# Patient Record
Sex: Male | Born: 1950 | Marital: Married | Smoking: Never smoker
Health system: Southern US, Community
[De-identification: ages and names within clinical notes are randomized; demographics above are authoritative.]

---

## 2010-12-17 ENCOUNTER — Inpatient Hospital Stay (INDEPENDENT_AMBULATORY_CARE_PROVIDER_SITE_OTHER)
Admission: RE | Admit: 2010-12-17 | Discharge: 2010-12-17 | Disposition: A | Discharge status: Home / Self Care | Payer: BC Managed Care – PPO | Source: Ambulatory Visit | Attending: Family Medicine | Admitting: Family Medicine

## 2010-12-17 DIAGNOSIS — B029 Zoster without complications: Secondary | ICD-10-CM

## 2011-10-28 ENCOUNTER — Encounter: Payer: Self-pay | Admitting: Internal Medicine

## 2011-10-28 ENCOUNTER — Ambulatory Visit (INDEPENDENT_AMBULATORY_CARE_PROVIDER_SITE_OTHER): Payer: BC Managed Care – PPO | Admitting: Internal Medicine

## 2011-10-28 DIAGNOSIS — Z23 Encounter for immunization: Secondary | ICD-10-CM

## 2011-10-28 DIAGNOSIS — Z0000 Encounter for general adult medical examination without abnormal findings: Secondary | ICD-10-CM | POA: Insufficient documentation

## 2011-10-28 NOTE — Progress Notes (Signed)
  Subjective:    Patient ID: Albert Fields, male    DOB: 1950-12-04, 61 y.o.   MRN: [id number]  HPI New patient, here for a CPX   Past medical history Shingles ~ 2012  Past surgical history Cataract surgery L 10-2011  Social history Married, 2 children Tobacco-- never ETOH-- socially Semi retired, Youth worker  Diet-- regular to healthy  Exercise-- active, some golf  Family history Diabetes-- no CAD-- heart dz uncles? Stroke-- no Colon cancer-- no Prostate cancer-- no   Review of Systems No chest pain or shortness of breath No nausea, vomiting, diarrhea. No blood in the stools Denies any hematuria or difficulty urinating. No anxiety or depression.     Objective:   Physical Exam  General -- alert, well-developed, and well-nourished.   Neck --no thyromegaly , normal carotid pulse Lungs -- normal respiratory effort, no intercostal retractions, no accessory muscle use, and normal breath sounds.   Heart-- normal rate, regular rhythm, no murmur, and no gallop.   Abdomen--soft, non-tender, no distention, no masses, no HSM, no guarding, and no rigidity. No bruit   Extremities-- no pretibial edema bilaterally Rectal-- No external abnormalities noted. Normal sphincter tone. No rectal masses or tenderness. Brown stool, Hemoccult negative Prostate:  Prostate gland firm and smooth, no enlargement, nodularity, tenderness, mass, asymmetry or induration. Neurologic-- alert & oriented X3 and strength normal in all extremities. Psych-- Cognition and judgment appear intact. Alert and cooperative with normal attention span and concentration.  not anxious appearing and not depressed appearing.      Assessment & Plan:

## 2011-10-28 NOTE — Assessment & Plan Note (Addendum)
Last TD? Provided a Tdap today Never had a cscope, recommend GI referral, will think about it, took w/ him an iFOB Will discuss zostavax next year Labs EKG, RBBB, no previous EKG. Patient asymptomatic. Will consider this EKG his baseline. Diet-exercise discussed  He has a number of skin lesions, recommend to see dermatology.

## 2011-10-28 NOTE — Patient Instructions (Signed)
Please come back fasting: FLP, CMP, CBC, TSH, PSA--- dx V70

## 2011-10-29 ENCOUNTER — Encounter: Payer: Self-pay | Admitting: Internal Medicine

## 2011-11-03 ENCOUNTER — Other Ambulatory Visit (INDEPENDENT_AMBULATORY_CARE_PROVIDER_SITE_OTHER): Payer: BC Managed Care – PPO

## 2011-11-03 DIAGNOSIS — Z0000 Encounter for general adult medical examination without abnormal findings: Secondary | ICD-10-CM

## 2011-11-03 LAB — CBC WITH DIFFERENTIAL/PLATELET
Basophils Relative: 0.5 % (ref 0.0–3.0)
Eosinophils Relative: 2.8 % (ref 0.0–5.0)
HCT: 42.2 % (ref 39.0–52.0)
Hemoglobin: 13.8 g/dL (ref 13.0–17.0)
Lymphs Abs: 2.1 10*3/uL (ref 0.7–4.0)
Monocytes Relative: 6.7 % (ref 3.0–12.0)
Neutro Abs: 4.4 10*3/uL (ref 1.4–7.7)
RBC: 4.39 Mil/uL (ref 4.22–5.81)
WBC: 7.2 10*3/uL (ref 4.5–10.5)

## 2011-11-03 LAB — LIPID PANEL
HDL: 63.4 mg/dL (ref >39.00)
Total CHOL/HDL Ratio: 3
Triglycerides: 52 mg/dL (ref 0.0–149.0)

## 2011-11-03 LAB — COMPREHENSIVE METABOLIC PANEL
ALT: 13 U/L (ref 0–53)
BUN: 15 mg/dL (ref 6–23)
CO2: 29 mEq/L (ref 19–32)
Creatinine, Ser: 1.1 mg/dL (ref 0.4–1.5)
GFR: 74.44 mL/min (ref >60.00)
Total Bilirubin: 0.6 mg/dL (ref 0.3–1.2)

## 2011-11-03 NOTE — Progress Notes (Signed)
Labs only

## 2011-11-10 ENCOUNTER — Other Ambulatory Visit (INDEPENDENT_AMBULATORY_CARE_PROVIDER_SITE_OTHER): Payer: BC Managed Care – PPO

## 2011-11-10 ENCOUNTER — Encounter: Payer: Self-pay | Admitting: *Deleted

## 2011-11-10 DIAGNOSIS — E039 Hypothyroidism, unspecified: Secondary | ICD-10-CM

## 2011-11-10 LAB — T3, FREE: T3, Free: 2.9 pg/mL (ref 2.3–4.2)

## 2011-11-10 NOTE — Progress Notes (Signed)
Labs only

## 2012-06-18 ENCOUNTER — Telehealth: Payer: Self-pay | Admitting: Internal Medicine

## 2012-06-18 DIAGNOSIS — E039 Hypothyroidism, unspecified: Secondary | ICD-10-CM

## 2012-06-18 NOTE — Telephone Encounter (Signed)
Please arrange TSH , free T3 and free T4---- dx hypothyroidism

## 2012-06-20 ENCOUNTER — Encounter: Payer: Self-pay | Admitting: *Deleted

## 2012-06-20 NOTE — Telephone Encounter (Signed)
Letter mailed to pt to notify him he is due to have labs rechecked.  Lab orders entered.

## 2013-04-05 ENCOUNTER — Ambulatory Visit (INDEPENDENT_AMBULATORY_CARE_PROVIDER_SITE_OTHER): Payer: BC Managed Care – PPO | Admitting: *Deleted

## 2013-04-05 DIAGNOSIS — Z23 Encounter for immunization: Secondary | ICD-10-CM

## 2013-04-12 ENCOUNTER — Ambulatory Visit: Payer: BC Managed Care – PPO | Admitting: *Deleted

## 2013-04-12 DIAGNOSIS — Z23 Encounter for immunization: Secondary | ICD-10-CM

## 2014-03-29 ENCOUNTER — Encounter (HOSPITAL_COMMUNITY): Payer: Self-pay | Admitting: Emergency Medicine

## 2014-03-29 ENCOUNTER — Emergency Department (HOSPITAL_COMMUNITY)
Admission: EM | Admit: 2014-03-29 | Discharge: 2014-03-29 | Disposition: A | Discharge status: Home / Self Care | Payer: BC Managed Care – PPO | Attending: Emergency Medicine | Admitting: Emergency Medicine

## 2014-03-29 ENCOUNTER — Emergency Department (HOSPITAL_COMMUNITY): Payer: BC Managed Care – PPO

## 2014-03-29 DIAGNOSIS — S99921A Unspecified injury of right foot, initial encounter: Secondary | ICD-10-CM | POA: Diagnosis present

## 2014-03-29 DIAGNOSIS — Y9289 Other specified places as the place of occurrence of the external cause: Secondary | ICD-10-CM | POA: Insufficient documentation

## 2014-03-29 DIAGNOSIS — Y9389 Activity, other specified: Secondary | ICD-10-CM | POA: Diagnosis not present

## 2014-03-29 DIAGNOSIS — W208XXA Other cause of strike by thrown, projected or falling object, initial encounter: Secondary | ICD-10-CM | POA: Insufficient documentation

## 2014-03-29 DIAGNOSIS — Z23 Encounter for immunization: Secondary | ICD-10-CM | POA: Diagnosis not present

## 2014-03-29 DIAGNOSIS — S70311A Abrasion, right thigh, initial encounter: Secondary | ICD-10-CM | POA: Insufficient documentation

## 2014-03-29 DIAGNOSIS — Z79899 Other long term (current) drug therapy: Secondary | ICD-10-CM | POA: Diagnosis not present

## 2014-03-29 DIAGNOSIS — S92311A Displaced fracture of first metatarsal bone, right foot, initial encounter for closed fracture: Secondary | ICD-10-CM | POA: Diagnosis not present

## 2014-03-29 DIAGNOSIS — S92301A Fracture of unspecified metatarsal bone(s), right foot, initial encounter for closed fracture: Secondary | ICD-10-CM

## 2014-03-29 LAB — BASIC METABOLIC PANEL
ANION GAP: 14 (ref 5–15)
BUN: 16 mg/dL (ref 6–23)
CHLORIDE: 104 meq/L (ref 96–112)
CO2: 22 mEq/L (ref 19–32)
Calcium: 10 mg/dL (ref 8.4–10.5)
Creatinine, Ser: 1.02 mg/dL (ref 0.50–1.35)
GFR calc non Af Amer: 76 mL/min — ABNORMAL LOW (ref >90)
GFR, EST AFRICAN AMERICAN: 88 mL/min — AB (ref >90)
Glucose, Bld: 98 mg/dL (ref 70–99)
POTASSIUM: 3.8 meq/L (ref 3.7–5.3)
Sodium: 140 mEq/L (ref 137–147)

## 2014-03-29 LAB — CBC
HCT: 39.4 % (ref 39.0–52.0)
HEMOGLOBIN: 13.8 g/dL (ref 13.0–17.0)
MCH: 31.7 pg (ref 26.0–34.0)
MCHC: 35 g/dL (ref 30.0–36.0)
MCV: 90.6 fL (ref 78.0–100.0)
Platelets: 231 10*3/uL (ref 150–400)
RBC: 4.35 MIL/uL (ref 4.22–5.81)
RDW: 13.3 % (ref 11.5–15.5)
WBC: 13.4 10*3/uL — AB (ref 4.0–10.5)

## 2014-03-29 MED ORDER — MORPHINE SULFATE 4 MG/ML IJ SOLN
6.0000 mg | Freq: Once | INTRAMUSCULAR | Status: DC
  Filled 2014-03-29: qty 2

## 2014-03-29 MED ORDER — TETANUS-DIPHTH-ACELL PERTUSSIS 5-2.5-18.5 LF-MCG/0.5 IM SUSP
0.5000 mL | Freq: Once | INTRAMUSCULAR | Status: AC
  Administered 2014-03-29: 0.5 mL via INTRAMUSCULAR
  Filled 2014-03-29: qty 0.5

## 2014-03-29 MED ORDER — MORPHINE SULFATE 4 MG/ML IJ SOLN: 6.0000 mg | Freq: Once | INTRAMUSCULAR | Status: DC

## 2014-03-29 MED ORDER — HYDROCODONE-ACETAMINOPHEN 5-325 MG PO TABS: 1.0000 | ORAL_TABLET | ORAL | Status: AC | PRN

## 2014-03-29 NOTE — Discharge Instructions (Signed)
Follow up with orthopaedics. Ice and elevate. Non weight bearing. If you were given medicines take as directed.  If you are on coumadin or contraceptives realize their levels and effectiveness is altered by many different medicines.  If you have any reaction (rash, tongues swelling, other) to the medicines stop taking and see a physician.   Please follow up as directed and return to the ER or see a physician for new or worsening symptoms.  Thank you. Filed Vitals:   03/29/14 1703 03/29/14 1902  BP: 155/59 137/67  Pulse: 79 88  Temp: 98.6 F (37 C) 98.9 F (37.2 C)  TempSrc: Oral Oral  Resp: 18 14  SpO2: 100% 100%    Cast or Splint Care Casts and splints support injured limbs and keep bones from moving while they heal. It is important to care for your cast or splint at home.  HOME CARE INSTRUCTIONS  Keep the cast or splint uncovered during the drying period. It can take 24 to 48 hours to dry if it is made of plaster. A fiberglass cast will dry in less than 1 hour.  Do not rest the cast on anything harder than a pillow for the first 24 hours.  Do not put weight on your injured limb or apply pressure to the cast until your health care provider gives you permission.  Keep the cast or splint dry. Wet casts or splints can lose their shape and may not support the limb as well. A wet cast that has lost its shape can also create harmful pressure on your skin when it dries. Also, wet skin can become infected.  Cover the cast or splint with a plastic bag when bathing or when out in the rain or snow. If the cast is on the trunk of the body, take sponge baths until the cast is removed.  If your cast does become wet, dry it with a towel or a blow dryer on the cool setting only.  Keep your cast or splint clean. Soiled casts may be wiped with a moistened cloth.  Do not place any hard or soft foreign objects under your cast or splint, such as cotton, toilet paper, lotion, or powder.  Do not try  to scratch the skin under the cast with any object. The object could get stuck inside the cast. Also, scratching could lead to an infection. If itching is a problem, use a blow dryer on a cool setting to relieve discomfort.  Do not trim or cut your cast or remove padding from inside of it.  Exercise all joints next to the injury that are not immobilized by the cast or splint. For example, if you have a long leg cast, exercise the hip joint and toes. If you have an arm cast or splint, exercise the shoulder, elbow, thumb, and fingers.  Elevate your injured arm or leg on 1 or 2 pillows for the first 1 to 3 days to decrease swelling and pain.It is best if you can comfortably elevate your cast so it is higher than your heart. SEEK MEDICAL CARE IF:   Your cast or splint cracks.  Your cast or splint is too tight or too loose.  You have unbearable itching inside the cast.  Your cast becomes wet or develops a soft spot or area.  You have a bad smell coming from inside your cast.  You get an object stuck under your cast.  Your skin around the cast becomes red or raw.  You have  new pain or worsening pain after the cast has been applied. SEEK IMMEDIATE MEDICAL CARE IF:   You have fluid leaking through the cast.  You are unable to move your fingers or toes.  You have discolored (blue or white), cool, painful, or very swollen fingers or toes beyond the cast.  You have tingling or numbness around the injured area.  You have severe pain or pressure under the cast.  You have any difficulty with your breathing or have shortness of breath.  You have chest pain. Document Released: 05/22/2000 Document Revised: 03/15/2013 Document Reviewed: 12/01/2012 [facility] Patient Information 2015 [place], [place]. This information is not intended to replace advice given to you by your health care provider. Make sure you discuss any questions you have with your health care provider.

## 2014-03-29 NOTE — ED Provider Notes (Signed)
CSN: [id number]     Arrival date & time 03/29/14  1533 History   First MD Initiated Contact with Patient 03/29/14 1708     Chief Complaint  Patient presents with  . Foot Injury     (Consider location/radiation/quality/duration/timing/severity/associated sxs/prior Treatment) HPI Comments: 63 year old right foot swelling and pain since prior to arrival. Patient had 4 doors fall on his dorsum of the right foot. Patient denies other significant injuries. Patient has mild abrasion above the right knee but no knee pain. Tetanus is not up-to-date. Pain with range of motion.  Patient is a 63 y.o. male presenting with foot injury. The history is provided by the patient.  Foot Injury Associated symptoms: no back pain, no fever and no neck pain     History reviewed. No pertinent past medical history. History reviewed. No pertinent past surgical history. Family History  Problem Relation Age of Onset  . Arthritis Mother   . Arthritis Father    History  Substance Use Topics  . Smoking status: Never Smoker   . Smokeless tobacco: Not on file  . Alcohol Use: Yes    Review of Systems  Constitutional: Negative for fever and chills.  HENT: Negative for congestion.   Eyes: Negative for visual disturbance.  Respiratory: Negative for shortness of breath.   Cardiovascular: Negative for chest pain.  Gastrointestinal: Negative for vomiting and abdominal pain.  Genitourinary: Negative for dysuria and flank pain.  Musculoskeletal: Positive for gait problem and joint swelling. Negative for back pain, neck pain and neck stiffness.  Skin: Negative for rash.  Neurological: Negative for weakness, light-headedness and headaches.      Allergies  Review of patient's allergies indicates no known allergies.  Home Medications   Prior to Admission medications   Medication Sig Start Date End Date Taking? Authorizing Provider  Ascorbic Acid (VITAMIN C) 100 MG tablet Take 100 mg by mouth daily.   Yes  Historical Provider, MD  Multiple Vitamins-Minerals (MULTIVITAMIN & MINERAL PO) Take 1 tablet by mouth daily.   Yes Historical Provider, MD  HYDROcodone-acetaminophen (NORCO) 5-325 MG per tablet Take 1-2 tablets by mouth every 4 (four) hours as needed. 03/29/14   Enid SkeensJoshua M Jani Moronta, MD   BP 137/67  Pulse 88  Temp(Src) 98.9 F (37.2 C) (Oral)  Resp 14  SpO2 100% Physical Exam  Nursing note and vitals reviewed. Constitutional: He is oriented to person, place, and time. He appears well-developed and well-nourished.  HENT:  Head: Normocephalic and atraumatic.  Eyes: Conjunctivae are normal. Right eye exhibits no discharge. Left eye exhibits no discharge.  Neck: Normal range of motion. Neck supple. No tracheal deviation present.  Cardiovascular: Normal rate and regular rhythm.   Pulmonary/Chest: Effort normal and breath sounds normal.  Abdominal: Soft. He exhibits no distension. There is no tenderness. There is no guarding.  Musculoskeletal: He exhibits no edema.  Patient has significant swelling and tenderness to mid and distal dorsum of the right foot, dorsalis pedis and posterior tibial pulses 2+. Significant tenderness to palpation. Patient has decreased range of motion due to pain and swelling. No ankle or knee tenderness or swelling on exam. On the right.  Neurological: He is alert and oriented to person, place, and time.  Skin: Skin is warm.  2 cm superficial abrasion  distal right thigh, no active bleeding  Psychiatric: He has a normal mood and affect.    ED Course  Procedures (including critical care time) Labs Review Labs Reviewed  CBC - Abnormal; Notable for the following:  WBC 13.4 (*)    All other components within normal limits  BASIC METABOLIC PANEL - Abnormal; Notable for the following:    GFR calc non Af Amer 76 (*)    GFR calc Af Amer 88 (*)    All other components within normal limits    Imaging Review No results found.   EKG Interpretation None      MDM    Final diagnoses:  Metatarsal fracture, right, closed, initial encounter  Right foot injury, initial encounter   Patient significant mechanism of injury to his right foot concern for foot fracture/midfoot fracture Lisfranc.  Plan for basic blood work in case patient needs to go to the operating room, x-ray and pain medicines. Tetanus updated.  X-ray right foot reviewed by myself showing multiple metatarsal fractures. Patient's pain improved on recheck. Patient requesting to go home and followup outpatient orthopedics. Patient has crutches, splint order placed.  Results and differential diagnosis were discussed with the patient/parent/guardian. Close follow up outpatient was discussed, comfortable with the plan.   Medications  morphine 4 MG/ML injection 6 mg (0 mg Intravenous Hold 03/29/14 1759)  Tdap (BOOSTRIX) injection 0.5 mL (0.5 mLs Intramuscular Given 03/29/14 1801)    Filed Vitals:   03/29/14 1703 03/29/14 1902  BP: 155/59 137/67  Pulse: 79 88  Temp: 98.6 F (37 C) 98.9 F (37.2 C)  TempSrc: Oral Oral  Resp: 18 14  SpO2: 100% 100%    Final diagnoses:  Metatarsal fracture, right, closed, initial encounter  Right foot injury, initial encounter       Enid SkeensJoshua M Jamaiya Tunnell, MD 03/29/14 1925

## 2014-03-29 NOTE — ED Notes (Signed)
Per patient-dropped four doors on right foot today. Right foot grossly swollen and bruised. Has full sensation but limited movement noted. 1+ pedal pulses and 2+ posterior tibial pulses. No medications taken today. Patient reports he is unable to place weight on foot. No other questions/concerns. MD Zavitz at bedside.

## 2014-03-29 NOTE — ED Notes (Signed)
Ortho tech called 

## 2015-07-01 IMAGING — CR DG FOOT COMPLETE 3+V*R*
3 series · 3 of 3 positions shown · non-contrast
Comparison: None.

CLINICAL DATA: Dropped doors on foot. Foot pain, bruising and
swelling. Initial encounter.

EXAM:
RIGHT FOOT COMPLETE - 3+ VIEW

[x foot ap right]
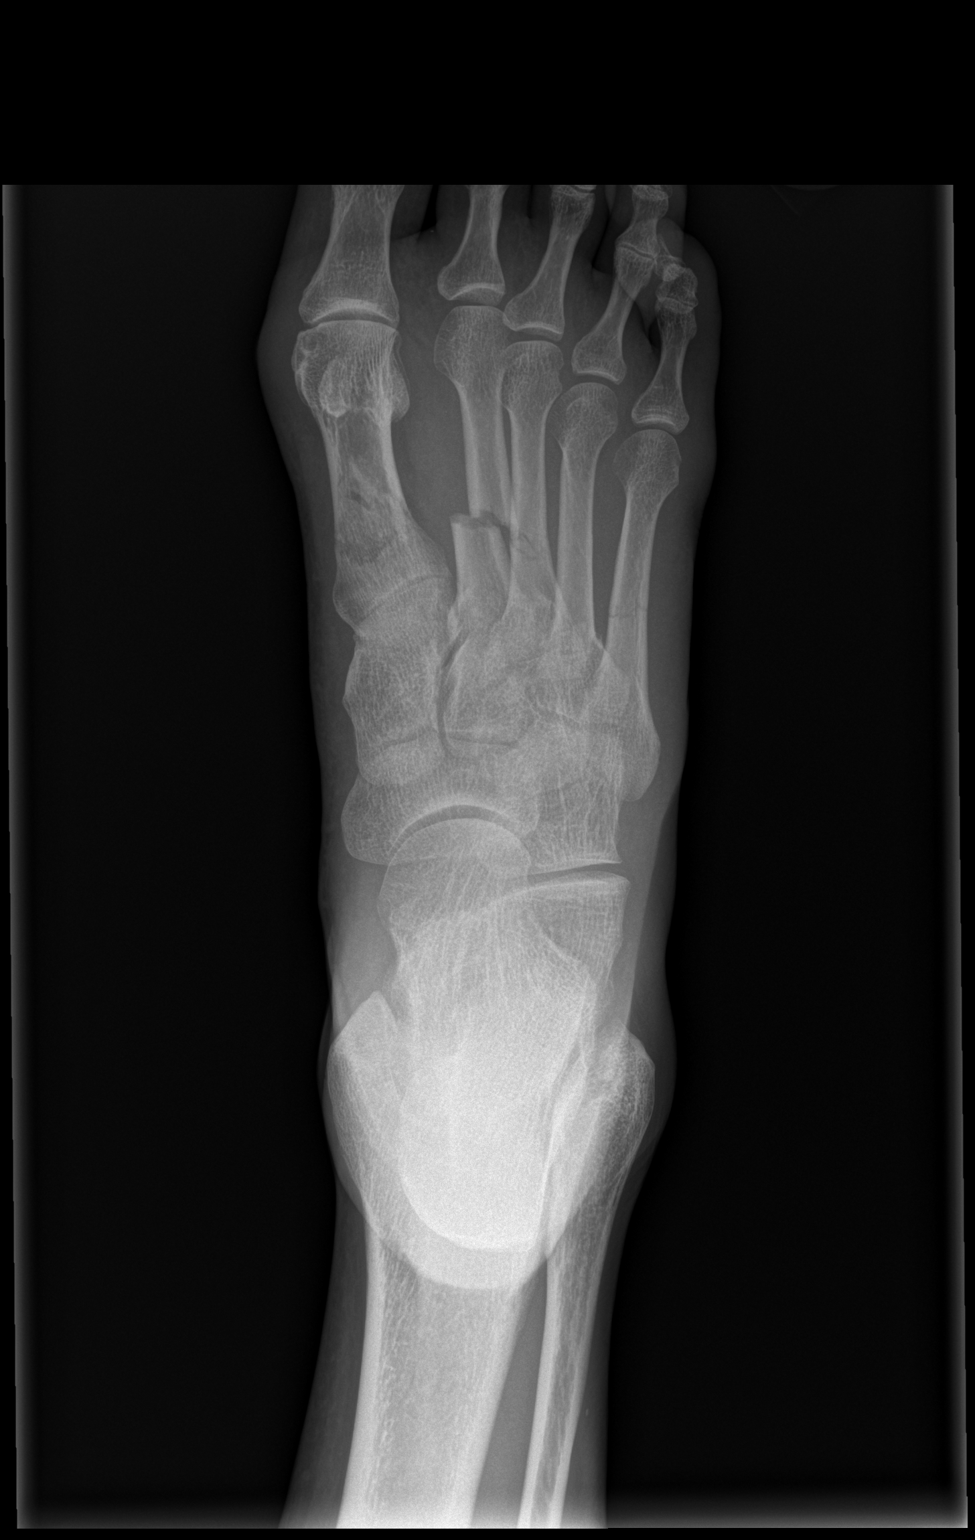

[x foot obl right]
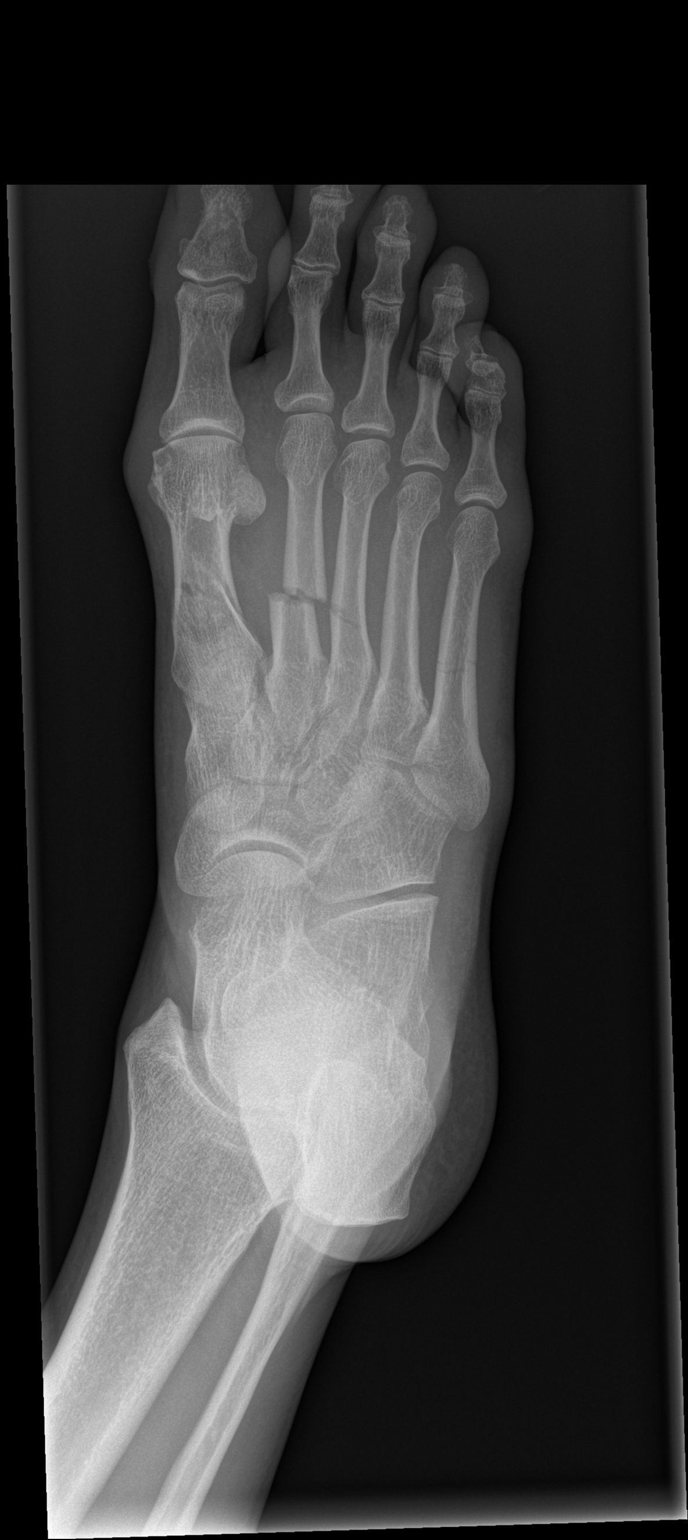

[x foot lat right]
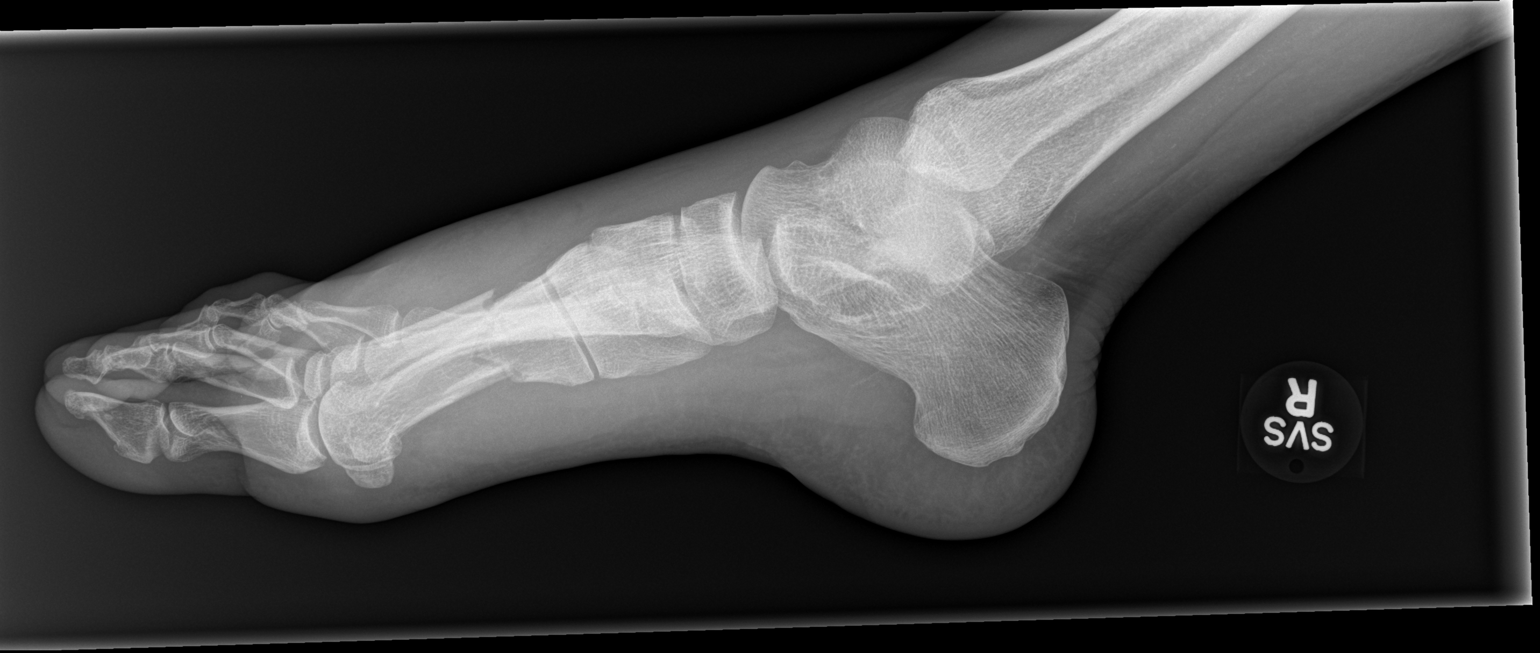

[3 of 3 positions shown; findings below may reference images not displayed]

FINDINGS: There are multiple fractures through the metatarsal bases. Fracture
through the proximal diaphysis of the first metatarsal is mildly
displaced. There is a laterally displaced transverse fracture
through the proximal diaphysis of the second metatarsal. There are
nondisplaced fractures involving the proximal third, fourth and
fifth metatarsals. No definite intra-articular extension of these
fractures or subluxation identified at the Lisfranc joint. Moderate
forefoot soft tissue swelling noted.
IMPRESSION: Multiple extra-articular fractures through the metatarsal bases as
described. No definite intra-articular extension identified. CT
should be considered for more definitive evaluation.

## 2016-06-23 ENCOUNTER — Telehealth: Payer: Self-pay | Admitting: *Deleted

## 2016-06-23 NOTE — Telephone Encounter (Signed)
Request for patient Medical Records from 2016-Present from [employer] ; on behalf of [employer] Policy Number [id number]; forwarded to [place] for Email and Scan/SLS 01/16

## 2019-07-08 ENCOUNTER — Ambulatory Visit: Payer: Self-pay

## 2019-07-13 ENCOUNTER — Ambulatory Visit: Payer: Medicare HMO | Attending: Internal Medicine

## 2019-07-13 DIAGNOSIS — Z23 Encounter for immunization: Secondary | ICD-10-CM

## 2019-07-13 NOTE — Progress Notes (Signed)
   Covid-19 Vaccination Clinic  Name:  Albert Fields    MRN: [id number] DOB: 06/27/1950  07/13/2019  Mr. Dupuy was observed post Covid-19 immunization for 15 minutes without incidence. He was provided with Vaccine Information Sheet and instruction to access the V-Safe system.   Mr. Kittelson was instructed to call 911 with any severe reactions post vaccine: Marland Kitchen Difficulty breathing  . Swelling of your face and throat  . A fast heartbeat  . A bad rash all over your body  . Dizziness and weakness    Immunizations Administered    Name Date Dose VIS Date Route   Pfizer COVID-19 Vaccine 07/13/2019  3:04 PM 0.3 mL 05/19/2019 Intramuscular   Manufacturer: [employer], [employer]   Lot: [id number]   NDC: [address]-6

## 2019-07-19 ENCOUNTER — Ambulatory Visit: Payer: Self-pay

## 2019-08-07 ENCOUNTER — Ambulatory Visit: Payer: Medicare HMO | Attending: Internal Medicine

## 2019-08-07 DIAGNOSIS — Z23 Encounter for immunization: Secondary | ICD-10-CM | POA: Insufficient documentation

## 2019-08-07 NOTE — Progress Notes (Signed)
   Covid-19 Vaccination Clinic  Name:  Albert Fields    MRN: [id number] DOB: 1950/07/17  08/07/2019  Mr. [name] was observed post Covid-19 immunization for 15 minutes without incidence. He was provided with Vaccine Information Sheet and instruction to access the V-Safe system.   Mr. [name] was instructed to call 911 with any severe reactions post vaccine: Marland Kitchen Difficulty breathing  . Swelling of your face and throat  . A fast heartbeat  . A bad rash all over your body  . Dizziness and weakness    Immunizations Administered    Name Date Dose VIS Date Route   Pfizer COVID-19 Vaccine 08/07/2019  2:11 PM 0.3 mL 05/19/2019 Intramuscular   Manufacturer: [employer], [employer]   Lot: [id number]   NDC: [address]-4

## 2023-12-14 ENCOUNTER — Other Ambulatory Visit (HOSPITAL_BASED_OUTPATIENT_CLINIC_OR_DEPARTMENT_OTHER): Payer: Self-pay

## 2023-12-14 MED ORDER — SHINGRIX 50 MCG/0.5ML IM SUSR
0.5000 mL | Freq: Once | INTRAMUSCULAR | 0 refills | Status: AC
  Filled 2023-12-14: qty 0.5, 1d supply, fill #0

## 2024-06-05 ENCOUNTER — Other Ambulatory Visit (HOSPITAL_BASED_OUTPATIENT_CLINIC_OR_DEPARTMENT_OTHER): Payer: Self-pay

## 2024-06-05 MED ORDER — FLUZONE HIGH-DOSE 0.5 ML IM SUSY
0.5000 mL | PREFILLED_SYRINGE | Freq: Once | INTRAMUSCULAR | 0 refills | Status: AC
  Filled 2024-06-05: qty 0.5, 1d supply, fill #0

## 2024-06-05 MED ORDER — SHINGRIX 50 MCG/0.5ML IM SUSR
0.5000 mL | Freq: Once | INTRAMUSCULAR | 0 refills | Status: AC
  Filled 2024-06-05: qty 0.5, 1d supply, fill #0
